# Patient Record
Sex: Female | Born: 1965 | Race: White | Hispanic: No | Marital: Married | Smoking: Never smoker
Health system: Southern US, Community
[De-identification: ages and names within clinical notes are randomized; demographics above are authoritative.]

## PROBLEM LIST (undated history)

## (undated) DIAGNOSIS — Z9221 Personal history of antineoplastic chemotherapy: Secondary | ICD-10-CM

## (undated) DIAGNOSIS — Z923 Personal history of irradiation: Secondary | ICD-10-CM

## (undated) DIAGNOSIS — R87619 Unspecified abnormal cytological findings in specimens from cervix uteri: Secondary | ICD-10-CM

## (undated) DIAGNOSIS — I10 Essential (primary) hypertension: Secondary | ICD-10-CM

## (undated) DIAGNOSIS — Z9889 Other specified postprocedural states: Secondary | ICD-10-CM

## (undated) DIAGNOSIS — R112 Nausea with vomiting, unspecified: Secondary | ICD-10-CM

## (undated) DIAGNOSIS — N6452 Nipple discharge: Secondary | ICD-10-CM

## (undated) HISTORY — DX: Unspecified abnormal cytological findings in specimens from cervix uteri: R87.619

## (undated) HISTORY — DX: Essential (primary) hypertension: I10

---
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
## 1999-03-02 ENCOUNTER — Inpatient Hospital Stay (HOSPITAL_COMMUNITY): Admission: AD | Admit: 1999-03-02 | Discharge: 1999-03-02 | Payer: Self-pay | Admitting: Obstetrics and Gynecology

## 1999-03-04 ENCOUNTER — Inpatient Hospital Stay (HOSPITAL_COMMUNITY): Admission: AD | Admit: 1999-03-04 | Discharge: 1999-03-04 | Payer: Self-pay | Admitting: *Deleted

## 1999-03-05 ENCOUNTER — Encounter (INDEPENDENT_AMBULATORY_CARE_PROVIDER_SITE_OTHER): Payer: Self-pay | Admitting: Specialist

## 1999-03-05 ENCOUNTER — Inpatient Hospital Stay (HOSPITAL_COMMUNITY): Admission: AD | Admit: 1999-03-05 | Discharge: 1999-03-08 | Payer: Self-pay | Admitting: Obstetrics and Gynecology

## 1999-04-19 ENCOUNTER — Other Ambulatory Visit: Admission: RE | Admit: 1999-04-19 | Discharge: 1999-04-19 | Payer: Self-pay | Admitting: Obstetrics and Gynecology

## 2000-06-12 ENCOUNTER — Other Ambulatory Visit: Admission: RE | Admit: 2000-06-12 | Discharge: 2000-06-12 | Payer: Self-pay | Admitting: Obstetrics and Gynecology

## 2000-09-11 ENCOUNTER — Ambulatory Visit (HOSPITAL_COMMUNITY): Admission: RE | Admit: 2000-09-11 | Discharge: 2000-09-11 | Payer: Self-pay | Admitting: Internal Medicine

## 2000-09-11 ENCOUNTER — Encounter: Payer: Self-pay | Admitting: Internal Medicine

## 2001-01-18 ENCOUNTER — Encounter: Admission: RE | Admit: 2001-01-18 | Discharge: 2001-01-18 | Payer: Self-pay | Admitting: Obstetrics and Gynecology

## 2001-01-18 ENCOUNTER — Encounter: Payer: Self-pay | Admitting: Obstetrics and Gynecology

## 2001-07-10 ENCOUNTER — Other Ambulatory Visit: Admission: RE | Admit: 2001-07-10 | Discharge: 2001-07-10 | Payer: Self-pay | Admitting: Obstetrics and Gynecology

## 2002-08-06 ENCOUNTER — Other Ambulatory Visit: Admission: RE | Admit: 2002-08-06 | Discharge: 2002-08-06 | Payer: Self-pay | Admitting: *Deleted

## 2003-09-08 ENCOUNTER — Other Ambulatory Visit: Admission: RE | Admit: 2003-09-08 | Discharge: 2003-09-08 | Payer: Self-pay | Admitting: Obstetrics and Gynecology

## 2004-09-14 ENCOUNTER — Other Ambulatory Visit: Admission: RE | Admit: 2004-09-14 | Discharge: 2004-09-14 | Payer: Self-pay | Admitting: Obstetrics and Gynecology

## 2005-10-19 ENCOUNTER — Other Ambulatory Visit: Admission: RE | Admit: 2005-10-19 | Discharge: 2005-10-19 | Payer: Self-pay | Admitting: Obstetrics and Gynecology

## 2008-05-22 ENCOUNTER — Other Ambulatory Visit: Admission: RE | Admit: 2008-05-22 | Discharge: 2008-05-22 | Payer: Self-pay | Admitting: Obstetrics & Gynecology

## 2008-12-21 ENCOUNTER — Ambulatory Visit (HOSPITAL_COMMUNITY): Admission: RE | Admit: 2008-12-21 | Discharge: 2008-12-22 | Payer: Self-pay | Admitting: Obstetrics & Gynecology

## 2008-12-21 ENCOUNTER — Encounter: Payer: Self-pay | Admitting: Obstetrics & Gynecology

## 2008-12-21 HISTORY — PX: TOTAL VAGINAL HYSTERECTOMY: SHX2548

## 2010-02-21 ENCOUNTER — Encounter: Admission: RE | Admit: 2010-02-21 | Discharge: 2010-02-21 | Payer: Self-pay | Admitting: Obstetrics & Gynecology

## 2010-03-01 ENCOUNTER — Encounter: Admission: RE | Admit: 2010-03-01 | Discharge: 2010-03-01 | Payer: Self-pay | Admitting: Obstetrics & Gynecology

## 2010-08-20 LAB — URINALYSIS, ROUTINE W REFLEX MICROSCOPIC
Bilirubin Urine: NEGATIVE
Glucose, UA: NEGATIVE mg/dL
Nitrite: NEGATIVE
Specific Gravity, Urine: <1.005 — ABNORMAL LOW (ref 1.005–1.030)
pH: 6 (ref 5.0–8.0)

## 2010-08-20 LAB — BASIC METABOLIC PANEL
BUN: 4 mg/dL — ABNORMAL LOW (ref 6–23)
BUN: 9 mg/dL (ref 6–23)
CO2: 25 mEq/L (ref 19–32)
Calcium: 9.4 mg/dL (ref 8.4–10.5)
Chloride: 106 mEq/L (ref 96–112)
GFR calc non Af Amer: >60 mL/min (ref >60)
Glucose, Bld: 109 mg/dL — ABNORMAL HIGH (ref 70–99)
Glucose, Bld: 92 mg/dL (ref 70–99)
Potassium: 3.8 mEq/L (ref 3.5–5.1)
Sodium: 139 mEq/L (ref 135–145)

## 2010-08-20 LAB — CBC
HCT: 42.8 % (ref 36.0–46.0)
MCHC: 34.6 g/dL (ref 30.0–36.0)
MCV: 94.8 fL (ref 78.0–100.0)
Platelets: 214 10*3/uL (ref 150–400)
Platelets: 228 10*3/uL (ref 150–400)
RDW: 12.4 % (ref 11.5–15.5)
WBC: 7 10*3/uL (ref 4.0–10.5)

## 2010-08-20 LAB — TYPE AND SCREEN: Antibody Screen: NEGATIVE

## 2010-08-20 LAB — APTT: aPTT: 28 seconds (ref 24–37)

## 2010-09-27 NOTE — Op Note (Signed)
April Henson                  ACCOUNT NO.:  [id number]   MEDICAL RECORD NO.:  [id number]          PATIENT TYPE:  OIB   LOCATION:  9303                          FACILITY:  [facility]   PHYSICIAN:  M. Leda Quail, MD  DATE OF BIRTH:  02-12-1966   DATE OF PROCEDURE:  12/21/2008  DATE OF DISCHARGE:                               OPERATIVE REPORT   PREOPERATIVE DIAGNOSES:  36. A 45 year old, gravida 1, para 1, married white female with      menorrhagia.  2. Dysfunctional uterine bleeding.  3. Fibroids.  4. Hypertension.  5. Failed Micronor therapy.   POSTOPERATIVE DIAGNOSES:  47. A 45 year old, gravida 1, para 1, married white female with      menorrhagia.  2. Dysfunctional uterine bleeding.  3. Fibroids.  4. Hypertension.  5. Failed Micronor therapy.   PROCEDURE:  Total vaginal hysterectomy.   SURGEON:  M. Leda Quail, MD   ASSISTANT:  April Felty. Romine, MD   ANESTHESIA:  General endotracheal, Dr. Arby Barrette oversaw the case.   FINDINGS:  Fibroid posteriorly.  Otherwise, normal-appearing ovaries and  tubes.   SPECIMENS:  Cervix and uterus sent to Pathology.   ESTIMATED BLOOD LOSS:  Less than 100.   URINE OUTPUT:  350 mL.   FLUIDS:  1600 mL of LR total.   COMPLICATIONS:  None.   INDICATIONS:  April Henson is a very nice 45 year old, G1, P75, married  white female with history of menorrhagia, dysfunctional uterine  bleeding, fibroids, and hypertension.  She has been experienced very  heavy menstrual cycles.  Due to her hypertension, she did not take  estrogen-containing pills and she has been on Micronor without much  success.  The regular bleeding has worsened due to Micronor.  She has  been counseled by conservative management versus continuing Micronor  versus Mirena IUD and endometrial ablation, and then definitive  management with surgery.  She has gone overall risks and benefits of  each procedure and has opted for hysterectomy.  All this was documented  in our  office chart and she presents for surgery today.   PROCEDURE:  The patient was taken to the operating room.  She was placed  in supine position.  Legs positioned in the April Henson stirrups in the low-  lithotomy position.  Anesthesia was administered by the anesthesia staff  without difficulty.  Right arm was tugged.  Left arm was extended with  running IV in place.  Good visualization of the hand.  Abdomen,  perineum, inner thighs, and vagina were prepped with Betadine prep and  in normal sterile fashion.  The patient is draped in this point.  Exam  under anesthesia is performed before I was gown and gloves and before  the prep was performed.  Once the prep was placed, then the patient's  legs were lifted to the high- lithotomy position.  A Foley catheter was  inserted under sterile conditions.  This was draped over the legs to  catheter straight drain.  Then, a Jacobson tenaculum was used to grasp  the cervix.  The cervix pulled down almost to the  vaginal opening.  Incision was made to proceed vaginally.  At this point, a weighted  speculum was placed in the posterior aspect of the vagina.  The cervix  was anesthetized with 1% Xylocaine mixed 1:1 with epinephrine(1:100,000  units.)  Posterior cul-de-sac was entered sharply.  A figure-of-eight  suture was used to attach posterior peritoneum and posterior vaginal  mucosa.  The uterosacral ligaments were serially clamped, cut, and  sutured with Heaney sutures.  Then, the anterior aspect of the cervix  was visualized.  Using a knife, the vaginal mucosa was incised from 3  o'clock to 9 o'clock position of cervix.  Then, using curved Mayo  scissors, pubovesical cervical fascia was dissected down to the level of  the cervix.  Then, using an open Ray-Tec plane between the bladder and  cervix was easily visualized.  The anterior flexion of peritoneum was  visualized.  It was a little to have get into the anterior peritoneum at  this point and  attention was turned back to the uterosacral ligament.  Cardinal ligaments were serially clamped, cut, and suture-ligated with  #0 Vicryl.  The uterine arteries were then clamped, incised, and suture-  ligated with #0 Vicryl.  Two sutures were placed over the uterine artery  pedicles bilaterally.  At this point, the anterior peritoneum could be  more easily visualized and was incised, and a Kirby retractor was placed  through this incision.  The fundus of the uterus was visualized.  The  broad ligament was serially clamped, incised, and suture-ligated with #0  Vicryl.  Once I was near the fundus of the uterus, the uterus could be  flipped and a curved Heaney clamps placed across the cornu of the uterus  bilaterally.  An open lap tape has been placed to push the bowel up  anteriorly.  These pedicles were incised and suture-ligated with free-  tie of 0 Vicryl, then a stick tie of #0 Vicryl.  Ovaries and tubes were  visualized.  No abnormalities were noted.  Pedicles were hemostatic at  this point.  The cuff was then run with a long 0 switched on stitch.  The running interlocking fashion started at 2 o'clock position ending at  10 o'clock position.  Normal saline was used to irrigate the pedicles  and the pelvis at this point.  No bleeding was noted.  An enterocele  stich was placed by passing a #2.0 vicryl through the posterior vaginal  mucosa and posterior peritoneum.  The medial third of the left  uterosacral ligament was encorporated.  The posterior peritoneum was  reefed across and the medial third of the right uterosacral ligament was  encorporated.  Finally the stich was brought out through the posterior  peritoneum.  The stich was tied tightly bringing the uterosacral  ligaments together at the vaginal cuff.  Three figure-of-eight sutures  of #0 Vicryl was then used to close the cuff.  At this point, all  instruments removed from the vagina.  The Foley catheter was removed.  The  patient's legs were positioned in the supine position.  She was  awakened from anesthesia and extubated without difficulty.  Sponge, lap,  needle, and instruments counts were correct x2.  A 30 mg IV Toradol was  given at this point.  She was taken to recovery room in stable  condition.      Lum Keas, MD  Electronically Signed     MSM/MEDQ  D:  12/21/2008  T:  12/22/2008  Job:  [id number] 

## 2010-09-27 NOTE — Discharge Summary (Signed)
NAMEJAILAH, April Henson                  ACCOUNT NO.:  [id number]   MEDICAL RECORD NO.:  [id number]          PATIENT TYPE:  OIB   LOCATION:  9303                          FACILITY:  [facility]   PHYSICIAN:  M. Leda Quail, MD  DATE OF BIRTH:  18-May-1965   DATE OF ADMISSION:  12/21/2008  DATE OF DISCHARGE:  12/22/2008                               DISCHARGE SUMMARY   PREOPERATIVE DIAGNOSES:  1. This is a 45 year old, G1, P60, married white female with      menorrhagia.  2. Dysfunctional uterine bleeding, on Micronor.  3. Fibroid uterus.  4. Hypertension.   DISCHARGE DIAGNOSES:  1. This is a 45 year old, G1, P92, married white female with      menorrhagia.  2. Dysfunctional uterine bleeding, on Micronor.  3. Fibroid uterus.  4. Hypertension.   PROCEDURE:  Transvaginal hysterectomy.   HISTORY OF PRESENT ILLNESS:  Written H and P is in the chart, but in  brief April Henson is a 45 year old married white female, who has a  history of menorrhagia.  She has been on Micronor in an attempt to  control, which has just made her bleeding more irregular.  She has  undergone an ultrasound and an endometrial biopsy which showed benign  tissue, but she did have fibroids on exam.  We talked about options for  her including endometrial ablation, Mirena IUD use, and definitive  management.  She ultimately decided to proceed with hysterectomy.  She  has done with childbearing.  Risks and benefits have been discussed with  the patient, and she presented on December 21, 2008, to proceed with  surgery.   HOSPITAL COURSE:  The patient was admitted to same-day surgery.  She was  taken to the operating room, transvaginal hysterectomy was performed.  She had a less than 100 mL of blood loss.  Surgery went very well and  lasted just less than an hour.  From the operating room, she was taken  to recovery room and her Foley catheter was removed before we left the  operating room.  She was given a Dilaudid low-dose  PCA for pain  management as well as schedule Toradol.  The patient did well in the  recovery room and from there she was transferred to the third floor.  She was able to void very easily and without difficulty.  Vital signs  were stable.  She was afebrile.  She was able to tolerate some liquids,  but she did have persistent nausea which Compazine nor Zofran helped.  Late in the evening of postoperative day #0, she was given some  Phenergan and this alleviated all of her issues.  She was able to sleep  and woke up without any nausea.  Since that time, she has done very  well, has been ambulating back and forth to the bathroom without any  issues with standing and also has passed flatus.  On the morning of  postoperative day #0, she reports excellent pain control and feeling  much much better.  T-max was 98.1, pulse was 50s-70s, blood pressure 112-  122/70-75, pulse  ox 96-97% on room air.  She voided 30-100 mL of clear  urine output.  Postop labs show white count 11.6, hemoglobin 13.2, and  platelets 214.  Postop labs also showed sodium 139, potassium 4.2, BUN  and creatinine 4 and 0.6 respectively.  Her exam was completely benign.  She had excellent bowel sounds.  Her abdomen was soft, nontender, and  nondistended.  Perineum was dry except for small amount of spotting on  her pad.  Extremities, no clubbing, cyanosis, or edema.  SCDs were  present bilaterally.  Her IV at this point, however, had already been  discontinued because it was causing her some tenderness at the site.  She was drinking liquids and had eaten crackers.  She had voided.  She  received regular breakfast and be transitioned to oral pain medicines.  If she does fine with all this, she will be discharged home in stable  condition with her husband.  Discharge instructions have been provided  in verbal form and will be provided in the written form.  She has  prescriptions for Motrin 800 mg 1 p.o. q.8 h. p.r.n. pain and  Percocet  5/325 one to two tablets p.o. q.4-6 hours p.r.n. pain.  The patient will  see me in approximately 2 weeks and she already has the prescription as  well.  The patient has my contact number if she has any issues or  concerns when she gets home.      Lum Keas, MD  Electronically Signed     MSM/MEDQ  D:  12/22/2008  T:  12/22/2008  Job:  [id number]

## 2011-06-27 ENCOUNTER — Other Ambulatory Visit: Payer: Self-pay | Admitting: Obstetrics & Gynecology

## 2011-06-27 DIAGNOSIS — Z1231 Encounter for screening mammogram for malignant neoplasm of breast: Secondary | ICD-10-CM

## 2011-07-06 ENCOUNTER — Ambulatory Visit
Admission: RE | Admit: 2011-07-06 | Discharge: 2011-07-06 | Disposition: A | Discharge status: Home / Self Care | Payer: 59 | Source: Ambulatory Visit | Attending: Obstetrics & Gynecology | Admitting: Obstetrics & Gynecology

## 2011-07-06 DIAGNOSIS — Z1231 Encounter for screening mammogram for malignant neoplasm of breast: Secondary | ICD-10-CM

## 2011-11-22 ENCOUNTER — Other Ambulatory Visit: Payer: Self-pay | Admitting: Obstetrics & Gynecology

## 2011-11-22 DIAGNOSIS — N6452 Nipple discharge: Secondary | ICD-10-CM

## 2011-12-05 ENCOUNTER — Ambulatory Visit
Admission: RE | Admit: 2011-12-05 | Discharge: 2011-12-05 | Disposition: A | Discharge status: Home / Self Care | Payer: 59 | Source: Ambulatory Visit | Attending: Obstetrics & Gynecology | Admitting: Obstetrics & Gynecology

## 2011-12-05 ENCOUNTER — Other Ambulatory Visit: Payer: Self-pay | Admitting: Obstetrics & Gynecology

## 2011-12-05 DIAGNOSIS — N6452 Nipple discharge: Secondary | ICD-10-CM

## 2011-12-21 ENCOUNTER — Ambulatory Visit (INDEPENDENT_AMBULATORY_CARE_PROVIDER_SITE_OTHER): Payer: Self-pay | Admitting: General Surgery

## 2012-01-08 ENCOUNTER — Ambulatory Visit (INDEPENDENT_AMBULATORY_CARE_PROVIDER_SITE_OTHER): Payer: 59 | Admitting: General Surgery

## 2012-01-08 ENCOUNTER — Encounter (INDEPENDENT_AMBULATORY_CARE_PROVIDER_SITE_OTHER): Payer: Self-pay | Admitting: General Surgery

## 2012-01-08 VITALS — BP 110/64 | HR 80 | Temp 97.8°F | Resp 20 | Ht 62.0 in | Wt 146.4 lb

## 2012-01-08 DIAGNOSIS — N6459 Other signs and symptoms in breast: Secondary | ICD-10-CM

## 2012-01-08 DIAGNOSIS — N6452 Nipple discharge: Secondary | ICD-10-CM | POA: Insufficient documentation

## 2012-01-08 NOTE — Patient Instructions (Signed)
IF YOU ARE TAKING ASPIRIN, COUMADIN/WARFARIN, PLAVIX, OR OTHER BLOOD THINNER, PLEASE LET us KNOW IMMEDIATELY.  WE WILL NEED TO DISCUSS WITH THE PRESCRIBING PROVIDER IF THESE ARE SAFE TO STOP. IF THESE ARE NOT STOPPED AT THE APPROPRIATE TIME, THIS WILL RESULT IN A DELAY FOR YOUR SURGERY.  DO NOT TAKE THESE MEDICATIONS OR IBUPROFEN/NAPROXEN WITHIN A WEEK BEFORE SURGERY.   The main risks of surgery are bleeding, infection, damage to other structures, and seroma (accumulation of fluid) under the incision site(s).    These complications may lead to additional procedures such as drainage of seroma/infection.  If cancer is found (unlikely), you may need other surgeries to obtain negative margins or to take lymph nodes.   Most women do accumulate fluid in the breast cavity where the specimen was removed. We do not always have to drain this fluid.  If your breast is very tense, painful, or red, then we may need to numb the skin and use a needle to aspirate the fluid.  We do provide patients with a Breast Binder.  The purpose of this is to avoid the use of tape on the sensitive tissue of the breast and to provide some compression to minimize the risk of seroma.  If the binder is uncomfortable, you may find that a tank top with a built-in shelf bra or a loose sports bra works better for you.  I recommend wearing this around the clock for the first 1-2 weeks except in the shower.    You may remove your dressings and may shower 48 hours after surgery.   Many patients have some constipation in the week after surgery due to the narcotics and anesthesia.  You may need over the counter stool softeners or laxatives if you experience difficulty having bowel movements.    If the following occur, call our office at [phone]: If you have a fever over 101 or pain that is severe despite narcotics. If you have redness or drainage at the wound. If you develop persistent nausea or vomiting.  I will follow you back up  in 1-4 weeks.    Please submit any paperwork about time off work/insurance forms to the front desk.

## 2012-01-08 NOTE — Assessment & Plan Note (Signed)
Plan major duct excision. Nipple discharge is pale and yellow, so cancer is unlikely.  The surgical procedure was described to the patient.  I discussed the incision type and location and whether we would need radiology involved on the day of surgery with a wire marker and/or sentinel node.      The risks and benefits of the procedure were described to the patient and he/she wishes to proceed.    We discussed the risks bleeding, infection, damage to other structures, need for further procedures/surgeries.  We discussed the risk of seroma.  The patient was advised if the area in the breast in cancer, we may need to go back to surgery for additional tissue to obtain negative margins or for a lymph node biopsy. The patient was advised that these are the most common complications, but that others can occur as well.  They were advised against taking aspirin or other anti-inflammatory agents/blood thinners the week before surgery.

## 2012-01-08 NOTE — Progress Notes (Signed)
Chief Complaint  Patient presents with  . Pre-op Exam    eval lt nipple discharge    HISTORY: Patient is a 46 year old female who has had approximately 6 months of left nipple discharge. This is intermittent.  She states it is worse after a hot shower. It is generally only when she presses on her nipple. Her breast is sore like the soreness that she used to get when she had her periods. She denies bloody nipple discharge. She denies feeling a mass. She has had one abnormal mammogram in the past that she got called back for, but has not had any biopsies. She has no family history of breast cancer, but her mother had uterine cancer.  Past Medical History  Diagnosis Date  . Hypertension     Past Surgical History  Procedure Date  . Abdominal hysterectomy     partial  . Wisdom tooth extraction 1986  . Broken nose     Current Outpatient Prescriptions  Medication Sig Dispense Refill  . lisinopril (PRINIVIL,ZESTRIL) 10 MG tablet       . NIFEDICAL XL 30 MG 24 hr tablet          Allergies  Allergen Reactions  . Codeine Hives and Itching     Family History  Problem Relation Age of Onset  . Cancer Mother     uterine  . Hypertension Mother   . Hyperlipidemia Mother   . Stroke Mother   . Hypertension Father      History   Social History  . Marital Status: Married    Spouse Name: N/A    Number of Children: N/A  . Years of Education: N/A   Social History Main Topics  . Smoking status: Never Smoker   . Smokeless tobacco: Never Used  . Alcohol Use: No  . Drug Use: No  . Sexually Active: None    REVIEW OF SYSTEMS - PERTINENT POSITIVES ONLY: 12 point review of systems negative other than HPI and PMH  EXAM: Filed Vitals:   01/08/12 1558  BP: 110/64  Pulse: 80  Temp: 97.8 F (36.6 C)  Resp: 20    Gen:  No acute distress.  Well nourished and well groomed.   Neurological: Alert and oriented to person, place, and time. Coordination normal.  Head: Normocephalic and  atraumatic.  Eyes: Conjunctivae are normal. Pupils are equal, round, and reactive to light. No scleral icterus.  Neck: Normal range of motion. Neck supple. No tracheal deviation or thyromegaly present.  Cardiovascular: Normal rate, regular rhythm, normal heart sounds and intact distal pulses.  Exam reveals no gallop and no friction rub.  No murmur heard. Respiratory: Effort normal.  No respiratory distress. No chest wall tenderness. Breath sounds normal.  No wheezes, rales or rhonchi.  Breast:  Breasts are symmetric bilaterally.  No masses either breast, but they are dense bilaterally.  Yellow nipple discharge can be expressed from left breast.  No axillary lymphadenopathy.   GI: Soft. Bowel sounds are normal. The abdomen is soft and nontender.  There is no rebound and no guarding.  Musculoskeletal: Normal range of motion. Extremities are nontender.  Lymphadenopathy: No cervical, preauricular, postauricular or axillary adenopathy is present Skin: Skin is warm and dry. No rash noted. No diaphoresis. No erythema. No pallor. No clubbing, cyanosis, or edema.   Psychiatric: Normal mood and affect. Behavior is normal. Judgment and thought content normal.    LABORATORY RESULTS: Available labs are reviewed    RADIOLOGY RESULTS: See E-Chart or I-Site for  most recent results.  Images and reports are reviewed.  Ductogram: IMPRESSION:  Intraductal filling defect, left breast for which surgical  consultation is recommended. An appointment is made with Dr.  Donell Beers on August 8 at 1:00 p.m.  RECOMMENDATION:  Recommend surgical excision, left breast mass.  BI-RADS CATEGORY 4: Suspicious abnormality - biopsy should be  considered.   Mammogram Findings: There is a fibroglandular pattern. No mass or  suspicious calcifications are seen in the left breast. Compared to  priors.  Mammographic images were processed with CAD    ASSESSMENT AND PLAN: No problem-specific assessment & plan notes found for  this encounter.     Maudry Diego MD Surgical Oncology, General and Endocrine Surgery [facility]      Visit Diagnoses: 1. Nipple discharge     Primary Care Physician: No primary provider on file.

## 2012-01-14 DIAGNOSIS — N6452 Nipple discharge: Secondary | ICD-10-CM

## 2012-01-14 HISTORY — DX: Nipple discharge: N64.52

## 2012-01-22 ENCOUNTER — Encounter (HOSPITAL_BASED_OUTPATIENT_CLINIC_OR_DEPARTMENT_OTHER): Payer: Self-pay | Admitting: *Deleted

## 2012-01-22 NOTE — Pre-Procedure Instructions (Signed)
To come for BMET; EKG req. from [facility]. 

## 2012-01-22 NOTE — Pre-Procedure Instructions (Signed)
No EKG at [facility] - will do EKG when pt. comes for lab

## 2012-01-23 ENCOUNTER — Encounter (HOSPITAL_BASED_OUTPATIENT_CLINIC_OR_DEPARTMENT_OTHER)
Admission: RE | Admit: 2012-01-23 | Discharge: 2012-01-23 | Disposition: A | Discharge status: Home / Self Care | Payer: 59 | Source: Ambulatory Visit | Attending: General Surgery | Admitting: General Surgery

## 2012-01-23 LAB — BASIC METABOLIC PANEL
CO2: 25 mEq/L (ref 19–32)
Chloride: 102 mEq/L (ref 96–112)
GFR calc Af Amer: >90 mL/min (ref >90)
Sodium: 136 mEq/L (ref 135–145)

## 2012-01-25 ENCOUNTER — Telehealth (INDEPENDENT_AMBULATORY_CARE_PROVIDER_SITE_OTHER): Payer: Self-pay

## 2012-01-25 NOTE — Telephone Encounter (Signed)
Pt calling b/c her post op appt got r/s from 9/30 to 10/21 and she does not want to wait that long to be checked by Dr Donell Beers after her sx date on 9/13. The pt wants to be seen sooner than later by Dr Donell Beers. I advised pt that Dr Donell Beers has had to cancel 2 all day offices due to surgery so that is the reason why she got r/s but the pt wants to be seen sooner.

## 2012-01-26 ENCOUNTER — Encounter (HOSPITAL_BASED_OUTPATIENT_CLINIC_OR_DEPARTMENT_OTHER): Payer: Self-pay | Admitting: General Surgery

## 2012-01-26 ENCOUNTER — Encounter (HOSPITAL_BASED_OUTPATIENT_CLINIC_OR_DEPARTMENT_OTHER): Payer: Self-pay | Admitting: Anesthesiology

## 2012-01-26 ENCOUNTER — Encounter (HOSPITAL_BASED_OUTPATIENT_CLINIC_OR_DEPARTMENT_OTHER)
Admission: RE | Disposition: A | Discharge status: Home / Self Care | Payer: Self-pay | Source: Ambulatory Visit | Attending: General Surgery

## 2012-01-26 ENCOUNTER — Ambulatory Visit (HOSPITAL_BASED_OUTPATIENT_CLINIC_OR_DEPARTMENT_OTHER): Payer: 59 | Admitting: Anesthesiology

## 2012-01-26 ENCOUNTER — Ambulatory Visit (HOSPITAL_BASED_OUTPATIENT_CLINIC_OR_DEPARTMENT_OTHER)
Admission: RE | Admit: 2012-01-26 | Discharge: 2012-01-26 | Disposition: A | Discharge status: Home / Self Care | Payer: 59 | Source: Ambulatory Visit | Attending: General Surgery | Admitting: General Surgery

## 2012-01-26 DIAGNOSIS — I10 Essential (primary) hypertension: Secondary | ICD-10-CM | POA: Insufficient documentation

## 2012-01-26 DIAGNOSIS — D249 Benign neoplasm of unspecified breast: Secondary | ICD-10-CM

## 2012-01-26 HISTORY — DX: Nipple discharge: N64.52

## 2012-01-26 HISTORY — DX: Other specified postprocedural states: Z98.890

## 2012-01-26 HISTORY — PX: BREAST DUCTAL SYSTEM EXCISION: SHX5242

## 2012-01-26 HISTORY — DX: Nausea with vomiting, unspecified: R11.2

## 2012-01-26 SURGERY — EXCISION DUCTAL SYSTEM BREAST
Anesthesia: General | Site: Breast | Laterality: Left | Wound class: Clean

## 2012-01-26 MED ORDER — ONDANSETRON HCL 4 MG/2ML IJ SOLN
INTRAMUSCULAR | Status: DC | PRN
  Administered 2012-01-26: 4 mg via INTRAVENOUS

## 2012-01-26 MED ORDER — LIDOCAINE HCL (CARDIAC) 20 MG/ML IV SOLN
INTRAVENOUS | Status: DC | PRN
  Administered 2012-01-26: 50 mg via INTRAVENOUS

## 2012-01-26 MED ORDER — CEFAZOLIN SODIUM-DEXTROSE 2-3 GM-% IV SOLR
2.0000 g | INTRAVENOUS | Status: AC
  Administered 2012-01-26: 2 g via INTRAVENOUS

## 2012-01-26 MED ORDER — HYDROCODONE-ACETAMINOPHEN 5-325 MG PO TABS: 1.0000 | ORAL_TABLET | ORAL | Status: AC | PRN

## 2012-01-26 MED ORDER — DEXAMETHASONE SODIUM PHOSPHATE 4 MG/ML IJ SOLN
INTRAMUSCULAR | Status: DC | PRN
  Administered 2012-01-26: 10 mg via INTRAVENOUS

## 2012-01-26 MED ORDER — IBUPROFEN 200 MG PO TABS: 600.0000 mg | ORAL_TABLET | Freq: Four times a day (QID) | ORAL | Status: AC | PRN

## 2012-01-26 MED ORDER — ACETAMINOPHEN 10 MG/ML IV SOLN
1000.0000 mg | Freq: Once | INTRAVENOUS | Status: AC
  Administered 2012-01-26: 1000 mg via INTRAVENOUS

## 2012-01-26 MED ORDER — PROPOFOL 10 MG/ML IV BOLUS
INTRAVENOUS | Status: DC | PRN
  Administered 2012-01-26: 200 mg via INTRAVENOUS

## 2012-01-26 MED ORDER — MIDAZOLAM HCL 5 MG/5ML IJ SOLN
INTRAMUSCULAR | Status: DC | PRN
  Administered 2012-01-26: 2 mg via INTRAVENOUS

## 2012-01-26 MED ORDER — FENTANYL CITRATE 0.05 MG/ML IJ SOLN
INTRAMUSCULAR | Status: DC | PRN
  Administered 2012-01-26: 25 ug via INTRAVENOUS
  Administered 2012-01-26: 50 ug via INTRAVENOUS
  Administered 2012-01-26: 25 ug via INTRAVENOUS
  Administered 2012-01-26: 50 ug via INTRAVENOUS

## 2012-01-26 MED ORDER — PHENYLEPHRINE HCL 10 MG/ML IJ SOLN
INTRAMUSCULAR | Status: DC | PRN
  Administered 2012-01-26: 80 ug via INTRAVENOUS

## 2012-01-26 MED ORDER — LACTATED RINGERS IV SOLN
INTRAVENOUS | Status: DC
  Administered 2012-01-26: 10 mL/h via INTRAVENOUS
  Administered 2012-01-26: 07:00:00 via INTRAVENOUS

## 2012-01-26 MED ORDER — OXYCODONE HCL 5 MG/5ML PO SOLN: 5.0000 mg | Freq: Once | ORAL | Status: DC | PRN

## 2012-01-26 MED ORDER — LIDOCAINE-EPINEPHRINE (PF) 1 %-1:200000 IJ SOLN
INTRAMUSCULAR | Status: DC | PRN
  Administered 2012-01-26: 08:00:00 via INTRAMUSCULAR

## 2012-01-26 MED ORDER — HYDROMORPHONE HCL PF 1 MG/ML IJ SOLN
0.2500 mg | INTRAMUSCULAR | Status: DC | PRN
  Administered 2012-01-26: 0.25 mg via INTRAVENOUS

## 2012-01-26 MED ORDER — PROMETHAZINE HCL 25 MG/ML IJ SOLN
6.2500 mg | Freq: Once | INTRAMUSCULAR | Status: AC
  Administered 2012-01-26: 6.25 mg via INTRAVENOUS

## 2012-01-26 MED ORDER — OXYCODONE HCL 5 MG PO TABS: 5.0000 mg | ORAL_TABLET | Freq: Once | ORAL | Status: DC | PRN

## 2012-01-26 MED ORDER — PROMETHAZINE HCL 25 MG/ML IJ SOLN: 6.2500 mg | INTRAMUSCULAR | Status: DC | PRN

## 2012-01-26 MED ORDER — SCOPOLAMINE 1 MG/3DAYS TD PT72
1.0000 | MEDICATED_PATCH | TRANSDERMAL | Status: DC
  Administered 2012-01-26: 1.5 mg via TRANSDERMAL

## 2012-01-26 SURGICAL SUPPLY — 43 items
BINDER BREAST LRG (GAUZE/BANDAGES/DRESSINGS) ×2 IMPLANT
BLADE HEX COATED 2.75 (ELECTRODE) ×2 IMPLANT
BLADE SURG 15 STRL LF DISP TIS (BLADE) ×1 IMPLANT
BLADE SURG 15 STRL SS (BLADE) ×1
CANISTER SUCTION 1200CC (MISCELLANEOUS) ×2 IMPLANT
CHLORAPREP W/TINT 26ML (MISCELLANEOUS) ×2 IMPLANT
CLIP TI WIDE RED SMALL 6 (CLIP) IMPLANT
CLOTH BEACON ORANGE TIMEOUT ST (SAFETY) ×2 IMPLANT
COVER MAYO STAND STRL (DRAPES) ×2 IMPLANT
COVER TABLE BACK 60X90 (DRAPES) ×2 IMPLANT
DECANTER SPIKE VIAL GLASS SM (MISCELLANEOUS) IMPLANT
DEVICE DUBIN W/COMP PLATE 8390 (MISCELLANEOUS) IMPLANT
DRAPE PED LAPAROTOMY (DRAPES) ×2 IMPLANT
DRAPE UTILITY XL STRL (DRAPES) ×2 IMPLANT
DRSG PAD ABDOMINAL 8X10 ST (GAUZE/BANDAGES/DRESSINGS) ×2 IMPLANT
ELECT REM PT RETURN 9FT ADLT (ELECTROSURGICAL) ×2
ELECTRODE REM PT RTRN 9FT ADLT (ELECTROSURGICAL) ×1 IMPLANT
GAUZE SPONGE 4X4 12PLY STRL LF (GAUZE/BANDAGES/DRESSINGS) ×2 IMPLANT
GLOVE BIO SURGEON STRL SZ 6 (GLOVE) ×2 IMPLANT
GLOVE BIOGEL PI IND STRL 6.5 (GLOVE) ×1 IMPLANT
GLOVE BIOGEL PI INDICATOR 6.5 (GLOVE) ×1
GOWN PREVENTION PLUS XLARGE (GOWN DISPOSABLE) IMPLANT
GOWN PREVENTION PLUS XXLARGE (GOWN DISPOSABLE) ×4 IMPLANT
NEEDLE HYPO 25X1 1.5 SAFETY (NEEDLE) ×2 IMPLANT
NS IRRIG 1000ML POUR BTL (IV SOLUTION) ×2 IMPLANT
PACK BASIN DAY SURGERY FS (CUSTOM PROCEDURE TRAY) ×2 IMPLANT
PENCIL BUTTON HOLSTER BLD 10FT (ELECTRODE) ×2 IMPLANT
SLEEVE SCD COMPRESS KNEE MED (MISCELLANEOUS) ×2 IMPLANT
SPONGE LAP 18X18 X RAY DECT (DISPOSABLE) ×2 IMPLANT
STAPLER VISISTAT 35W (STAPLE) IMPLANT
STRIP CLOSURE SKIN 1/2X4 (GAUZE/BANDAGES/DRESSINGS) ×2 IMPLANT
SUT MON AB 4-0 PC3 18 (SUTURE) ×2 IMPLANT
SUT SILK 2 0 SH (SUTURE) ×2 IMPLANT
SUT VIC AB 3-0 54X BRD REEL (SUTURE) IMPLANT
SUT VIC AB 3-0 BRD 54 (SUTURE)
SUT VIC AB 3-0 SH 27 (SUTURE) ×1
SUT VIC AB 3-0 SH 27X BRD (SUTURE) ×1 IMPLANT
SYR CONTROL 10ML LL (SYRINGE) ×2 IMPLANT
TOWEL OR 17X24 6PK STRL BLUE (TOWEL DISPOSABLE) ×2 IMPLANT
TOWEL OR NON WOVEN STRL DISP B (DISPOSABLE) ×2 IMPLANT
TUBE CONNECTING 20X1/4 (TUBING) ×2 IMPLANT
WATER STERILE IRR 1000ML POUR (IV SOLUTION) IMPLANT
YANKAUER SUCT BULB TIP NO VENT (SUCTIONS) ×2 IMPLANT

## 2012-01-26 NOTE — Anesthesia Preprocedure Evaluation (Signed)
Anesthesia Evaluation  Patient identified by MRN, date of birth, ID band Patient awake    Reviewed: Allergy & Precautions, H&P , NPO status , Patient's Chart, lab work & pertinent test results  History of Anesthesia Complications (+) PONV  Airway Mallampati: II TM Distance: >3 FB Neck ROM: Full    Dental No notable dental hx. (+) Teeth Intact and Dental Advisory Given   Pulmonary neg pulmonary ROS,  breath sounds clear to auscultation  Pulmonary exam normal       Cardiovascular hypertension, On Medications Rhythm:Regular Rate:Normal     Neuro/Psych negative neurological ROS  negative psych ROS   GI/Hepatic negative GI ROS, Neg liver ROS,   Endo/Other  negative endocrine ROS  Renal/GU negative Renal ROS  negative genitourinary   Musculoskeletal   Abdominal   Peds  Hematology negative hematology ROS (+)   Anesthesia Other Findings   Reproductive/Obstetrics negative OB ROS                           Anesthesia Physical Anesthesia Plan  ASA: II  Anesthesia Plan: General   Post-op Pain Management:    Induction: Intravenous  Airway Management Planned: LMA  Additional Equipment:   Intra-op Plan:   Post-operative Plan: Extubation in OR  Informed Consent: I have reviewed the patients History and Physical, chart, labs and discussed the procedure including the risks, benefits and alternatives for the proposed anesthesia with the patient or authorized representative who has indicated his/her understanding and acceptance.   Dental advisory given  Plan Discussed with: CRNA  Anesthesia Plan Comments:         Anesthesia Quick Evaluation

## 2012-01-26 NOTE — H&P (View-Only) (Signed)
Chief Complaint  Patient presents with  . Pre-op Exam    eval lt nipple discharge    HISTORY: Patient is a 46-year-old female who has had approximately 6 months of left nipple discharge. This is intermittent.  She states it is worse after a hot shower. It is generally only when she presses on her nipple. Her breast is sore like the soreness that she used to get when she had her periods. She denies bloody nipple discharge. She denies feeling a mass. She has had one abnormal mammogram in the past that she got called back for, but has not had any biopsies. She has no family history of breast cancer, but her mother had uterine cancer.  Past Medical History  Diagnosis Date  . Hypertension     Past Surgical History  Procedure Date  . Abdominal hysterectomy     partial  . Wisdom tooth extraction 1986  . Broken nose     Current Outpatient Prescriptions  Medication Sig Dispense Refill  . lisinopril (PRINIVIL,ZESTRIL) 10 MG tablet       . NIFEDICAL XL 30 MG 24 hr tablet          Allergies  Allergen Reactions  . Codeine Hives and Itching     Family History  Problem Relation Age of Onset  . Cancer Mother     uterine  . Hypertension Mother   . Hyperlipidemia Mother   . Stroke Mother   . Hypertension Father      History   Social History  . Marital Status: Married    Spouse Name: N/A    Number of Children: N/A  . Years of Education: N/A   Social History Main Topics  . Smoking status: Never Smoker   . Smokeless tobacco: Never Used  . Alcohol Use: No  . Drug Use: No  . Sexually Active: None    REVIEW OF SYSTEMS - PERTINENT POSITIVES ONLY: 12 point review of systems negative other than HPI and PMH  EXAM: Filed Vitals:   01/08/12 1558  BP: 110/64  Pulse: 80  Temp: 97.8 F (36.6 C)  Resp: 20    Gen:  No acute distress.  Well nourished and well groomed.   Neurological: Alert and oriented to person, place, and time. Coordination normal.  Head: Normocephalic and  atraumatic.  Eyes: Conjunctivae are normal. Pupils are equal, round, and reactive to light. No scleral icterus.  Neck: Normal range of motion. Neck supple. No tracheal deviation or thyromegaly present.  Cardiovascular: Normal rate, regular rhythm, normal heart sounds and intact distal pulses.  Exam reveals no gallop and no friction rub.  No murmur heard. Respiratory: Effort normal.  No respiratory distress. No chest wall tenderness. Breath sounds normal.  No wheezes, rales or rhonchi.  Breast:  Breasts are symmetric bilaterally.  No masses either breast, but they are dense bilaterally.  Yellow nipple discharge can be expressed from left breast.  No axillary lymphadenopathy.   GI: Soft. Bowel sounds are normal. The abdomen is soft and nontender.  There is no rebound and no guarding.  Musculoskeletal: Normal range of motion. Extremities are nontender.  Lymphadenopathy: No cervical, preauricular, postauricular or axillary adenopathy is present Skin: Skin is warm and dry. No rash noted. No diaphoresis. No erythema. No pallor. No clubbing, cyanosis, or edema.   Psychiatric: Normal mood and affect. Behavior is normal. Judgment and thought content normal.    LABORATORY RESULTS: Available labs are reviewed    RADIOLOGY RESULTS: See E-Chart or I-Site for   most recent results.  Images and reports are reviewed.  Ductogram: IMPRESSION:  Intraductal filling defect, left breast for which surgical  consultation is recommended. An appointment is made with Dr.  Marylou Wages on August 8 at 1:00 p.m.  RECOMMENDATION:  Recommend surgical excision, left breast mass.  BI-RADS CATEGORY 4: Suspicious abnormality - biopsy should be  considered.   Mammogram Findings: There is a fibroglandular pattern. No mass or  suspicious calcifications are seen in the left breast. Compared to  priors.  Mammographic images were processed with CAD    ASSESSMENT AND PLAN: No problem-specific assessment & plan notes found for  this encounter.     Alic Hilburn L Hanh Kertesz MD Surgical Oncology, General and Endocrine Surgery [facility]      Visit Diagnoses: 1. Nipple discharge     Primary Care Physician: No primary provider on file.    

## 2012-01-26 NOTE — Interval H&P Note (Signed)
History and Physical Interval Note:  01/26/2012 7:35 AM  April Henson  has presented today for surgery, with the diagnosis of left nipple discharge  The various methods of treatment have been discussed with the patient and family. After consideration of risks, benefits and other options for treatment, the patient has consented to  Procedure(s) (LRB) with comments: EXCISION DUCTAL SYSTEM BREAST (Left) - left breast major duct excision as a surgical intervention .  The patient's history has been reviewed, patient examined, no change in status, stable for surgery.  I have reviewed the patient's chart and labs.  Questions were answered to the patient's satisfaction.     Ellanor Feuerstein

## 2012-01-26 NOTE — Op Note (Signed)
Excisional Breast Biopsy, Major Duct excision  Indications: This patient presents with history of left nipple discharge with suspicion for papilloma on ductogram  Pre-operative Diagnosis: left nipple discharge  Post-operative Diagnosis: left nipple discharge  Surgeon: WUJWJX,BJYNW   Anesthesia: General endotracheal anesthesia  ASA Class: 2  Procedure Details  The patient was seen in the Holding Room. The risks, benefits, complications, treatment options, and expected outcomes were discussed with the patient. The possibilities of reaction to medication, pulmonary aspiration, bleeding, infection, the need for additional procedures, failure to diagnose a condition, and creating a complication requiring transfusion or operation were discussed with the patient. The patient concurred with the proposed plan, giving informed consent.  The site of surgery properly noted/marked. The patient was taken to Operating Room # 8, identified, and the procedure verified as Breast Excisional Biopsy. A Time Out was held and the above information confirmed.  After induction of anesthesia, the left  breast and chest were prepped and draped in standard fashion.  The lacrimal duct probes were used to locate the duct with the discharge.   The lumpectomy was performed by creating an oblique incision over the lower outer quadrant of the breast in the circumareolar location.  Dissection was carried down around the lacrimal duct probe in an almost straight posterior direction with the curved Mayo scissors.  Orientation sutures were placed and the skin and pectoralis margins were inked.  Hemostasis was achieved with cautery.  The wound was irrigated and closed with a 3-0 Vicryl interrupted deep dermal stitch and a 4-0 Monocryl subcuticular closure in layers.    Sterile dressings were applied. At the end of the operation, all sponge, instrument, and needle counts were correct.  Findings: grossly clear surgical  margins  Estimated Blood Loss:  Minimal  Specimens: left breast major duct excision         Complications:  None; patient tolerated the procedure well.         Disposition: PACU - hemodynamically stable.         Condition: stable

## 2012-01-26 NOTE — Transfer of Care (Signed)
Immediate Anesthesia Transfer of Care Note  Patient: April Henson  Procedure(s) Performed: Procedure(s) (LRB) with comments: EXCISION DUCTAL SYSTEM BREAST (Left) - left breast major duct excision  Patient Location: PACU  Anesthesia Type: General  Level of Consciousness: awake, alert  and oriented  Airway & Oxygen Therapy: Patient Spontanous Breathing and Patient connected to face mask oxygen  Post-op Assessment: Report given to PACU RN and Post -op Vital signs reviewed and stable  Post vital signs: Reviewed and stable  Complications: No apparent anesthesia complications

## 2012-01-26 NOTE — Anesthesia Postprocedure Evaluation (Signed)
  Anesthesia Post-op Note  Patient: April Henson  Procedure(s) Performed: Procedure(s) (LRB) with comments: EXCISION DUCTAL SYSTEM BREAST (Left) - left breast major duct excision  Patient Location: PACU  Anesthesia Type: General  Level of Consciousness: awake and alert   Airway and Oxygen Therapy: Patient Spontanous Breathing  Post-op Pain: mild  Post-op Assessment: Post-op Vital signs reviewed, Patient's Cardiovascular Status Stable, Respiratory Function Stable, Patent Airway and No signs of Nausea or vomiting  Post-op Vital Signs: Reviewed and stable  Complications: No apparent anesthesia complications

## 2012-01-29 NOTE — Progress Notes (Signed)
Quick Note:  Please call pt and let know that pathology is Benign. ______

## 2012-01-30 ENCOUNTER — Telehealth (INDEPENDENT_AMBULATORY_CARE_PROVIDER_SITE_OTHER): Payer: Self-pay

## 2012-01-30 ENCOUNTER — Encounter (HOSPITAL_BASED_OUTPATIENT_CLINIC_OR_DEPARTMENT_OTHER): Payer: Self-pay | Admitting: General Surgery

## 2012-01-30 NOTE — Telephone Encounter (Signed)
I left message for patient to return our phone call. Her path came back benign. Please give patient results if she calls back.

## 2012-01-31 NOTE — Telephone Encounter (Signed)
Pt called this morning and was given pathology results.

## 2012-02-12 ENCOUNTER — Encounter (INDEPENDENT_AMBULATORY_CARE_PROVIDER_SITE_OTHER): Payer: 59 | Admitting: General Surgery

## 2012-02-15 ENCOUNTER — Ambulatory Visit (INDEPENDENT_AMBULATORY_CARE_PROVIDER_SITE_OTHER): Payer: 59 | Admitting: General Surgery

## 2012-02-15 ENCOUNTER — Encounter (INDEPENDENT_AMBULATORY_CARE_PROVIDER_SITE_OTHER): Payer: Self-pay | Admitting: General Surgery

## 2012-02-15 VITALS — BP 114/66 | HR 68 | Temp 99.2°F | Resp 16 | Ht 62.0 in | Wt 147.0 lb

## 2012-02-15 DIAGNOSIS — N6459 Other signs and symptoms in breast: Secondary | ICD-10-CM

## 2012-02-15 DIAGNOSIS — N6452 Nipple discharge: Secondary | ICD-10-CM

## 2012-02-15 NOTE — Assessment & Plan Note (Signed)
Doing well.   Follow up as needed.  Get back on mammogram schedule.

## 2012-02-15 NOTE — Patient Instructions (Signed)
Follow up as needed  Activity as tolerated

## 2012-02-15 NOTE — Progress Notes (Signed)
HISTORY: S/p major duct excision.  Only taking occasional tylenol for soreness.  Has some firm areas at surgical site.  No wound drainage.      EXAM: General:  Alert and oriented Incision:  No erythema.  Small hematoma.     PATHOLOGY: Diagnosis Breast, excision, Left - MULTIPLE INTRADUCTAL PAPILLOMAS. - DILATED DUCTS AND FIBROCYSTIC CHANGES. - NO ATYPIA, HYPERPLASIA OR MALIGNANCY IDENTIFIED.   ASSESSMENT AND PLAN:   Left Nipple discharge Doing well.   Follow up as needed.  Get back on mammogram schedule.      Maudry Diego, MD Surgical Oncology, General & Endocrine Surgery [facility]  [place], [place], [place] E.,*

## 2012-03-04 ENCOUNTER — Encounter (INDEPENDENT_AMBULATORY_CARE_PROVIDER_SITE_OTHER): Payer: 59 | Admitting: General Surgery

## 2015-05-16 HISTORY — PX: BREAST LUMPECTOMY: SHX2

## 2016-01-12 ENCOUNTER — Other Ambulatory Visit: Payer: Self-pay | Admitting: Obstetrics & Gynecology

## 2016-01-12 DIAGNOSIS — Z1231 Encounter for screening mammogram for malignant neoplasm of breast: Secondary | ICD-10-CM

## 2016-01-19 ENCOUNTER — Ambulatory Visit
Admission: RE | Admit: 2016-01-19 | Discharge: 2016-01-19 | Disposition: A | Discharge status: Home / Self Care | Payer: Commercial Managed Care - HMO | Source: Ambulatory Visit | Attending: Obstetrics & Gynecology | Admitting: Obstetrics & Gynecology

## 2016-01-19 DIAGNOSIS — Z1231 Encounter for screening mammogram for malignant neoplasm of breast: Secondary | ICD-10-CM

## 2016-01-21 ENCOUNTER — Other Ambulatory Visit: Payer: Self-pay | Admitting: Obstetrics & Gynecology

## 2016-01-21 DIAGNOSIS — R928 Other abnormal and inconclusive findings on diagnostic imaging of breast: Secondary | ICD-10-CM

## 2016-01-26 ENCOUNTER — Ambulatory Visit
Admission: RE | Admit: 2016-01-26 | Discharge: 2016-01-26 | Disposition: A | Discharge status: Home / Self Care | Payer: Commercial Managed Care - HMO | Source: Ambulatory Visit | Attending: Obstetrics & Gynecology | Admitting: Obstetrics & Gynecology

## 2016-01-26 DIAGNOSIS — R928 Other abnormal and inconclusive findings on diagnostic imaging of breast: Secondary | ICD-10-CM

## 2016-01-28 ENCOUNTER — Encounter: Payer: Self-pay | Admitting: Nurse Practitioner

## 2016-01-28 ENCOUNTER — Ambulatory Visit (INDEPENDENT_AMBULATORY_CARE_PROVIDER_SITE_OTHER): Payer: Commercial Managed Care - HMO | Admitting: Nurse Practitioner

## 2016-01-28 ENCOUNTER — Other Ambulatory Visit: Payer: Commercial Managed Care - HMO

## 2016-01-28 VITALS — BP 122/80 | HR 68 | Ht 61.5 in | Wt 147.0 lb

## 2016-01-28 DIAGNOSIS — Z01419 Encounter for gynecological examination (general) (routine) without abnormal findings: Secondary | ICD-10-CM

## 2016-01-28 DIAGNOSIS — N6320 Unspecified lump in the left breast, unspecified quadrant: Secondary | ICD-10-CM

## 2016-01-28 DIAGNOSIS — Z0000 Encounter for general adult medical examination without abnormal findings: Secondary | ICD-10-CM | POA: Diagnosis not present

## 2016-01-28 DIAGNOSIS — N63 Unspecified lump in breast: Secondary | ICD-10-CM | POA: Diagnosis not present

## 2016-01-28 LAB — POCT URINALYSIS DIPSTICK
BILIRUBIN UA: NEGATIVE
GLUCOSE UA: NEGATIVE
Ketones, UA: NEGATIVE
Leukocytes, UA: NEGATIVE
Nitrite, UA: NEGATIVE
PH UA: 6
Protein, UA: NEGATIVE
RBC UA: NEGATIVE
UROBILINOGEN UA: NEGATIVE

## 2016-01-28 NOTE — Progress Notes (Signed)
Scheduled patient while in office for left breast diagnostic mammogram with ultrasound at the [facility] on 01/31/2016 at 2:10 pm. Patient is agreeable to date and time. Placed in mammogram hold.

## 2016-01-28 NOTE — Progress Notes (Signed)
Patient ID: April Henson, female   DOB: [birth year], 50 y.o.   MRN: [id number]  50 y.o. [id number] Married  Caucasian Fe here for [place] annual exam.  She had a recent Mammo and needs a follow up but has not been here since 2010.   She saw Dr. Sabra Heck for AUB, fibroids and dysmenorrhea. No other new health problems.  Some vaso symptoms that are tolerable.  Patient's last menstrual period was 12/13/2008 (approximate).          Sexually active: Yes.    The current method of family planning is status post hysterectomy 12/21/08    Exercising: Yes.    walking and light weights three times per week Smoker:  no  Health Maintenance: Pap:  05/22/08, Negative (hyst 12/21/2008) MMG: previous Breast, excision, Left 01/26/12 - MULTIPLE INTRADUCTAL PAPILLOMAS. - DILATED DUCTS AND FIBROCYSTIC CHANGES. - NO ATYPIA, HYPERPLASIA OR MALIGNANCY IDENTIFIED 01/19/16, 3D, Bi-Rads 0: need additional imaging; needs order TDaP:  Within last 10 years HIV: 2000 with pregnancy Labs: PCP takes care of all labs  Urine: Negative   reports that she has never smoked. She has never used smokeless tobacco. She reports that she does not drink alcohol or use drugs.  Past Medical History:  Diagnosis Date  . Hypertension    under control with meds., has been on meds. x 15 yrs.  . Nipple discharge 01/2012   left  . PONV (postoperative nausea and vomiting)     Past Surgical History:  Procedure Laterality Date  . BREAST DUCTAL SYSTEM EXCISION  01/26/2012   Procedure: EXCISION DUCTAL SYSTEM BREAST;  Surgeon: Stark Klein, MD;  Location: [facility];  Service: General;  Laterality: Left;  left breast major duct excision  . TOTAL VAGINAL HYSTERECTOMY  12/21/2008  . WISDOM TOOTH EXTRACTION  1986    Current Outpatient Prescriptions  Medication Sig Dispense Refill  . lisinopril (PRINIVIL,ZESTRIL) 5 MG tablet Take 5 mg by mouth daily.     No current facility-administered medications for this visit.     Family History  Problem  Relation Age of Onset  . Cancer Mother     uterine  . Hypertension Mother   . Hyperlipidemia Mother   . Stroke Mother   . Glaucoma Mother   . Rheum arthritis Mother   . Osteoporosis Mother   . Hypertension Father   . Heart disease Maternal Grandmother   . Parkinson's disease Maternal Grandfather     ROS:  Pertinent items are noted in HPI.  Otherwise, a comprehensive ROS was negative.  Exam:   BP 122/80 (BP Location: Left Arm, Patient Position: Sitting, Cuff Size: Normal)   Pulse 68   Ht 5' 1.5" (1.562 m)   Wt 147 lb (66.7 kg)   LMP 12/13/2008 (Approximate)   BMI 27.33 kg/m  Height: 5' 1.5" (156.2 cm) Ht Readings from Last 3 Encounters:  01/28/16 5' 1.5" (1.562 m)  02/15/12 5\' 2"  (1.575 m)  01/22/12 5\' 2"  (1.575 m)    General appearance: alert, cooperative and appears stated age Head: Normocephalic, without obvious abnormality, atraumatic Neck: no adenopathy, supple, symmetrical, trachea midline and thyroid normal to inspection and palpation Lungs: clear to auscultation bilaterally Breasts: normal appearance, no masses or tenderness, positive findings: 2 cm, smooth, round, soft and non-tender nodule located on the left at 1:30 position about 3 cm from the areola. Heart: regular rate and rhythm Abdomen: soft, non-tender; no masses,  no organomegaly Extremities: extremities normal, atraumatic, no cyanosis or edema Skin: Skin color,  texture, turgor normal. No rashes or lesions Lymph nodes: Cervical, supraclavicular, and axillary nodes normal. No abnormal inguinal nodes palpated Neurologic: Grossly normal   Pelvic: External genitalia:  no lesions              Urethra:  normal appearing urethra with no masses, tenderness or lesions              Bartholin's and Skene's: normal                 Vagina: normal appearing vagina with normal color and discharge, no lesions              Cervix: absent              Pap taken: Yes.   Bimanual Exam:  Uterus:  uterus absent               Adnexa: no mass, fullness, tenderness               Rectovaginal: Confirms               Anus:  normal sphincter tone, no lesions  Chaperone present: yes  A:  Well Woman with normal exam  S/P TVH  12/21/08 secondary to AUB, fibroids, and dysmenorrhea.  Remote history of abnormal pap about 1994 and treated with cryo  Vaso symptoms that are tolerable  S/P left breast lumpectomy for multiple intraductal papilloma 01/26/12  New breast lump X 2 weeks - needs further evaluation  History of HTN   P:   Reviewed health and wellness pertinent to exam  Pap smear was done  Mammogram diagnostic is scheduled and will follow  Counseled on breast self exam, adequate intake of calcium and vitamin D, diet and exercise return annually or prn  An After Visit Summary was printed and given to the patient.

## 2016-01-28 NOTE — Patient Instructions (Signed)

## 2016-01-30 NOTE — Progress Notes (Signed)
Encounter reviewed by Dr. Mattew Chriswell Amundson C. Silva.  

## 2016-01-31 ENCOUNTER — Ambulatory Visit
Admission: RE | Admit: 2016-01-31 | Discharge: 2016-01-31 | Disposition: A | Discharge status: Home / Self Care | Payer: Commercial Managed Care - HMO | Source: Ambulatory Visit | Attending: Nurse Practitioner | Admitting: Nurse Practitioner

## 2016-01-31 ENCOUNTER — Other Ambulatory Visit: Payer: Self-pay | Admitting: Nurse Practitioner

## 2016-01-31 DIAGNOSIS — N6320 Unspecified lump in the left breast, unspecified quadrant: Secondary | ICD-10-CM

## 2016-02-01 LAB — IPS PAP TEST WITH HPV

## 2016-02-08 ENCOUNTER — Ambulatory Visit
Admission: RE | Admit: 2016-02-08 | Discharge: 2016-02-08 | Disposition: A | Discharge status: Home / Self Care | Payer: Commercial Managed Care - HMO | Source: Ambulatory Visit | Attending: Nurse Practitioner | Admitting: Nurse Practitioner

## 2016-02-08 ENCOUNTER — Other Ambulatory Visit: Payer: Self-pay | Admitting: Nurse Practitioner

## 2016-02-08 DIAGNOSIS — N6320 Unspecified lump in the left breast, unspecified quadrant: Secondary | ICD-10-CM

## 2016-02-09 ENCOUNTER — Telehealth: Payer: Self-pay | Admitting: *Deleted

## 2016-02-09 NOTE — Telephone Encounter (Signed)
Spoke with patient to confirm [facility] appt for 10/4. Patient stated she could not talk right now and would call me back.

## 2016-02-10 ENCOUNTER — Telehealth: Payer: Self-pay | Admitting: Nurse Practitioner

## 2016-02-10 DIAGNOSIS — C50912 Malignant neoplasm of unspecified site of left female breast: Secondary | ICD-10-CM

## 2016-02-10 NOTE — Telephone Encounter (Signed)
Spoke with Randel Pigg with [place]. Maudie Mercury states that for patients with new diagnosis of breast cancer their refer patients to surgeon  Dr.Mark Arrendondo who is with [facility] at [address] [place], [address]. Per Maudie Mercury the patient will be seen for evaluation with Dr.Arrendondo and then referral will be placed to [facility].  Dr.Silva, okay to proceed with referral at this time?

## 2016-02-10 NOTE — Telephone Encounter (Signed)
Spoke with Museum/gallery conservator at [place] with [place]. Per [employer] with breast cancer patients who need referrals for treatment they are referred to [facility] in [facility] where they are able to receive radiation and chemotherapy if needed. Referral placed through [facility] to Dr.Arrendondo. Patient demographics, insurance, and diagnosis information faxed to [phone] with cover sheet and confirmation. Per Jinny Blossom their office will contact the patient directly to schedule.  Left message for patient to call [place] at [phone].

## 2016-02-10 NOTE — Telephone Encounter (Signed)
OK to place referral, but patient will need to understand if she will be able to complete her care in [facility] or if she will need to be traveling to [facility] also.  This may affect her choice in starting her care in [facility].

## 2016-02-10 NOTE — Telephone Encounter (Signed)
Patient is returning a call to Kaitlyn. °

## 2016-02-10 NOTE — Telephone Encounter (Signed)
Spoke with patient. Advised patient I have spoken with [place] for recommendations of breast oncologists in [facility]. Advised per discussion with Breast Care Navigator referral will need to be placed to Dr.Arrendondo who is a Psychologist, sport and exercise at [place]. Advised I have placed this referral. Dr.Arrendondo's office will contact her directly to schedule her first appointment. Advised if referral is needed to medical oncologist she will be referred to [facility] in [facility]. Patient is agreeable and verbalizes understanding.  Cc: Kem Boroughs, FNP   Routing to provider for final review. Patient agreeable to disposition. Will close encounter.

## 2016-02-10 NOTE — Telephone Encounter (Signed)
Spoke with Jeani Hawking from the [facility]. Jeani Hawking states the patient has been diagnosed with left breast cancer. The patient was referred to [facility] at [facility], but after discussing this with her spouse has decided that being seen in [facility] will be better for them. Per Jeani Hawking the [facility] is unable to refer outside of the [place] area. She is requesting our office make a referral for the patient to a provider in [facility]. Advised I will review with our physicians regarding referral recommendations and will start referral process. Jeani Hawking is agreeable.  Routing to [place] for review and advise.

## 2016-02-10 NOTE — Telephone Encounter (Signed)
April Henson with the [place] [place] calling regarding patient's biopsy which showed left breast cancer. Referral was made for patient to see someone in [place] but she lives in [place] and would like a referral to someone there. Breast center number is [phone].

## 2016-02-10 NOTE — Telephone Encounter (Signed)
Call to [employer] left a message for Randel Pigg who is the Breast Health Navigator. Requested return call to discuss recommendations for breast oncologists and referral process.

## 2016-02-10 NOTE — Telephone Encounter (Signed)
I would recommend contacting [facility] to see if they have breast oncologists who serve in the [employer].   [place]

## 2016-02-15 ENCOUNTER — Telehealth: Payer: Self-pay | Admitting: Nurse Practitioner

## 2016-02-15 NOTE — Telephone Encounter (Signed)
Pt was called and informed that we are aware of her new diagnosis of breast cancer.  Offered support and any help with getting MD information in [facility].  She is appreciative of call and will let us know if anything else needs to be done.

## 2016-02-16 ENCOUNTER — Telehealth: Payer: Self-pay | Admitting: Nurse Practitioner

## 2016-02-16 NOTE — Telephone Encounter (Signed)
Patient has questions about biopsy results

## 2016-02-21 NOTE — Telephone Encounter (Signed)
Please contact patient to understand if she has established care with an oncology team for her new diagnosis of breast cancer.  She was referred to Dr Adair Laundry, the breast surgeon, on 02/14/16.

## 2016-02-22 NOTE — Telephone Encounter (Signed)
Left message to call Kaitlyn at [phone]. 

## 2016-02-23 NOTE — Telephone Encounter (Signed)
Ok to remove from mammogram hold.  

## 2016-02-23 NOTE — Telephone Encounter (Signed)
Spoke with patient. Patient states that she has established care with Dr.Arrendondo and has had multiple appointments. She is established with an oncology team. States she does not have any questions at this time as she has discussed them with Dr.Arrendondo.  Routing to provider for final review. Patient agreeable to disposition. Will close encounter.

## 2016-02-23 NOTE — Telephone Encounter (Signed)
Patient removed from mammogram hold.

## 2016-02-23 NOTE — Telephone Encounter (Signed)
Patient is returning your call.  

## 2016-12-29 ENCOUNTER — Other Ambulatory Visit: Payer: Self-pay | Admitting: Radiation Oncology

## 2016-12-29 DIAGNOSIS — Z853 Personal history of malignant neoplasm of breast: Secondary | ICD-10-CM

## 2017-01-24 ENCOUNTER — Ambulatory Visit
Admission: RE | Admit: 2017-01-24 | Discharge: 2017-01-24 | Disposition: A | Discharge status: Home / Self Care | Payer: 59 | Source: Ambulatory Visit | Attending: Radiation Oncology | Admitting: Radiation Oncology

## 2017-01-24 DIAGNOSIS — Z853 Personal history of malignant neoplasm of breast: Secondary | ICD-10-CM

## 2017-01-24 HISTORY — DX: Personal history of antineoplastic chemotherapy: Z92.21

## 2017-01-24 HISTORY — DX: Personal history of irradiation: Z92.3

## 2019-04-16 IMAGING — MG 2D DIGITAL DIAGNOSTIC BILATERAL MAMMOGRAM WITH CAD AND ADJUNCT T
8 of 16 series · 8 of 36 positions shown · non-contrast
Comparison: Previous exam(s).

CLINICAL DATA: 51-year-old female status post left breast
lumpectomy in 2259 and subareolar duct excision in 7646.

EXAM:
2D DIGITAL DIAGNOSTIC BILATERAL MAMMOGRAM WITH CAD AND ADJUNCT TOMO

[L MLO (1 of 2)]
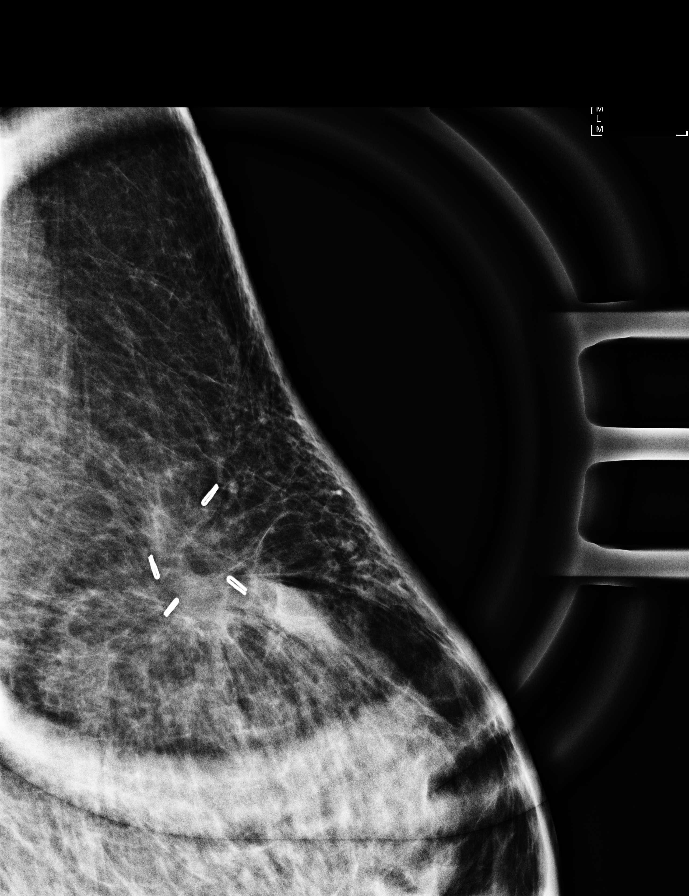

[R CC]
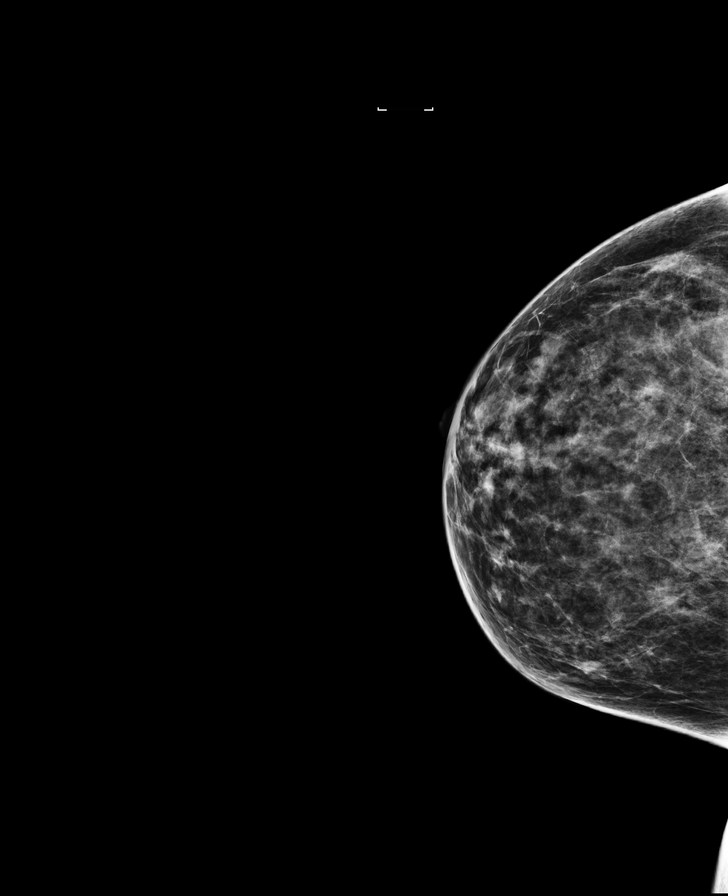

[L MLO (2 of 2)]
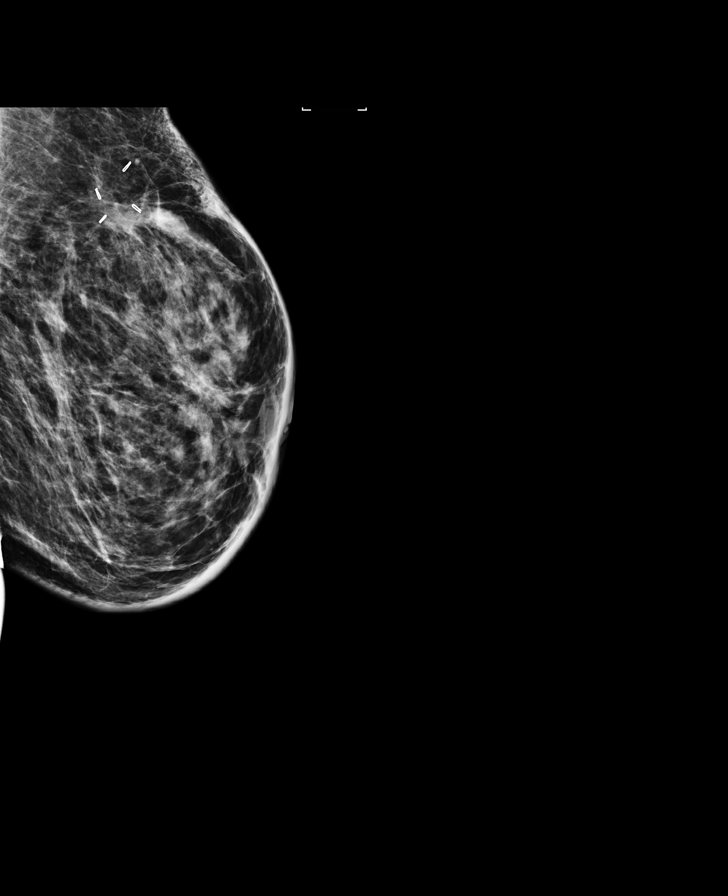

[R MLO (1 of 2)]
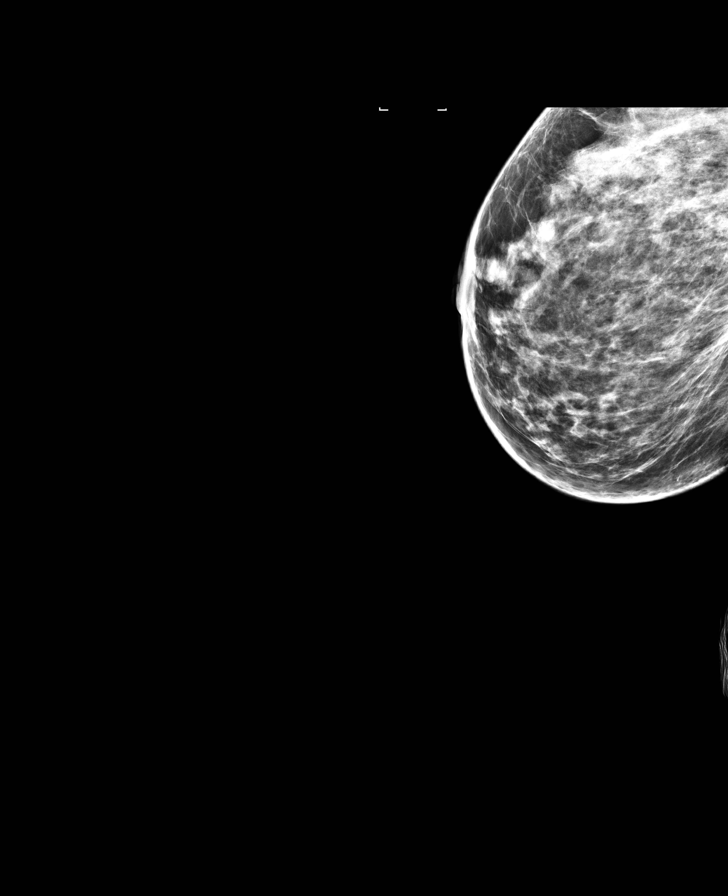

[R MLO (2 of 2)]
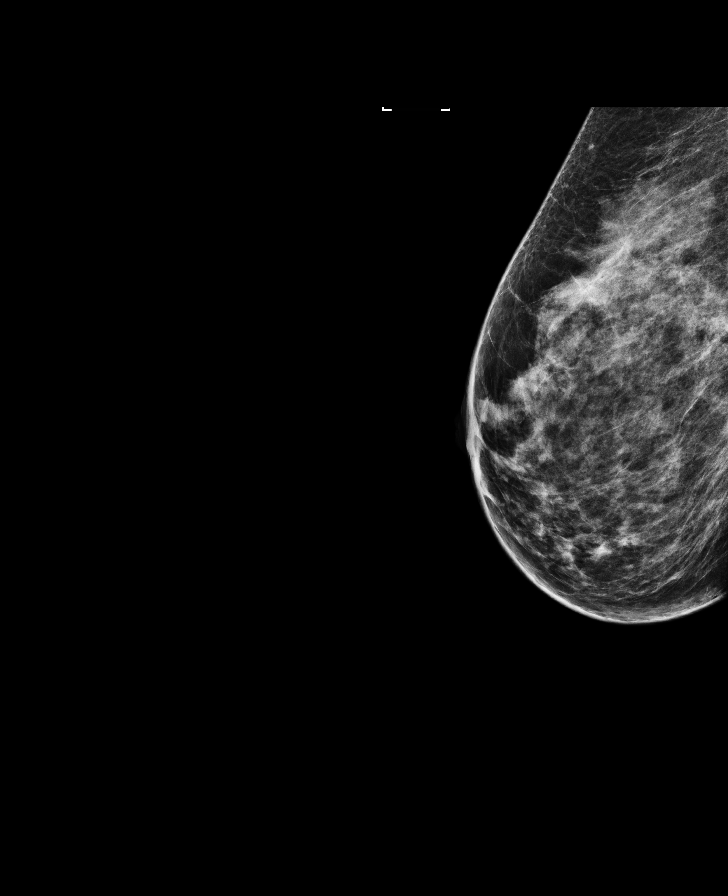

[L MLO synth-2D]
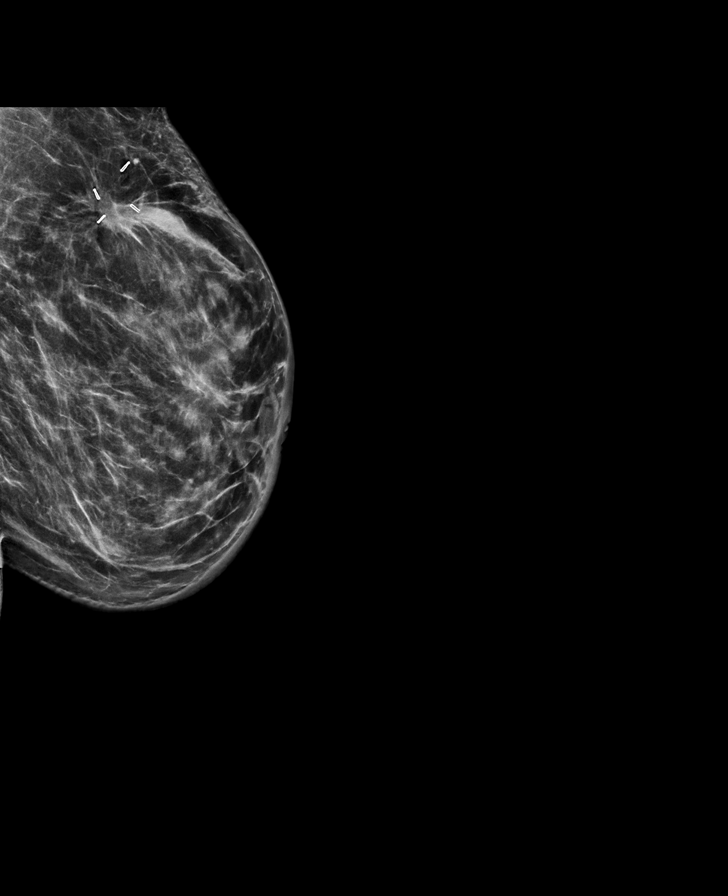

[L CC]
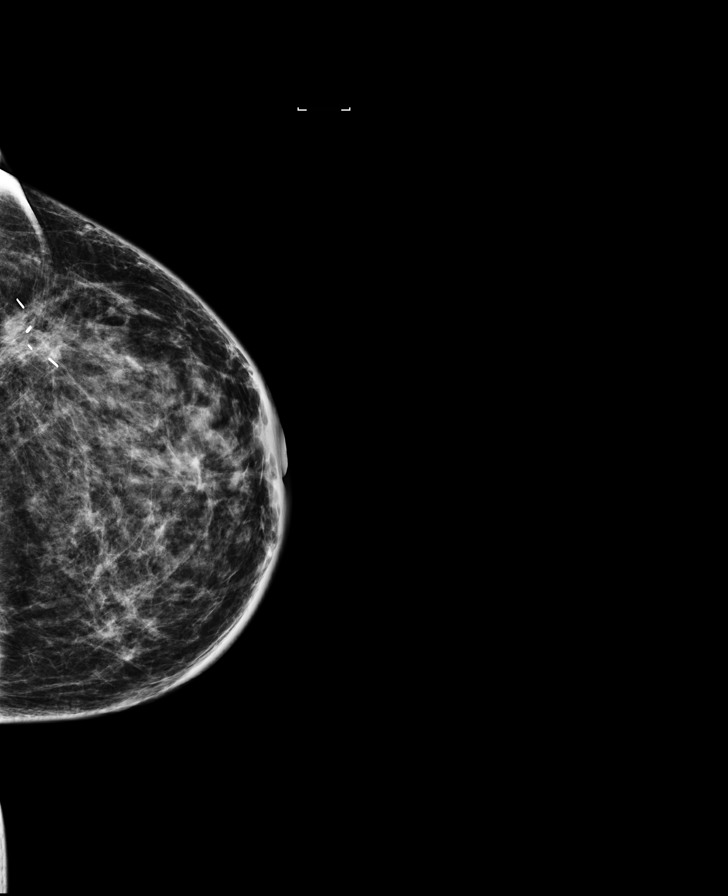

[R MLO synth-2D]
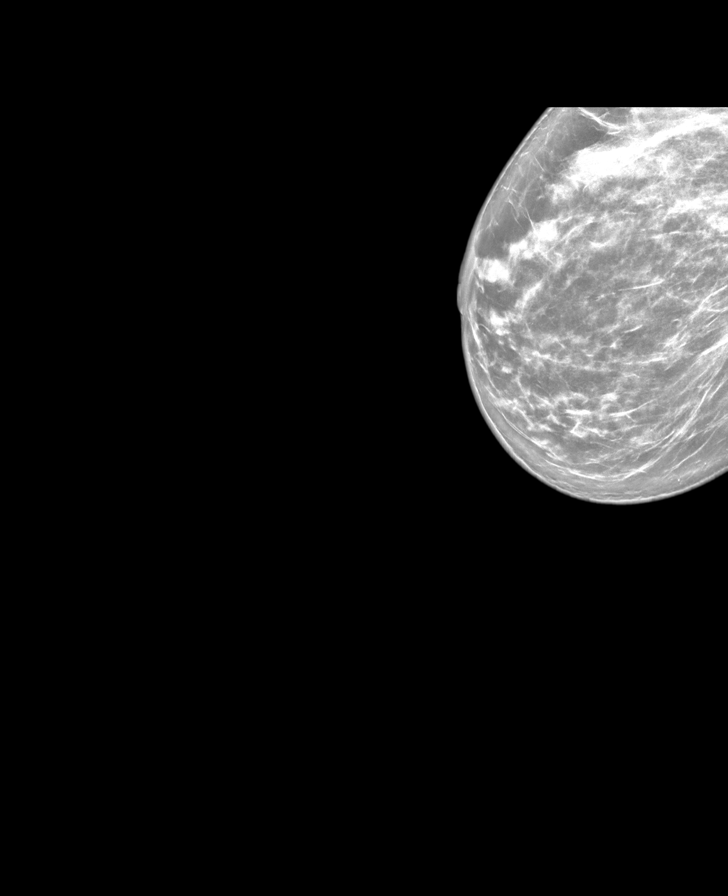

[8 of 36 positions shown; findings below may reference images not displayed]

ACR Breast Density Category c: The breast tissue is heterogeneously
dense, which may obscure small masses.
FINDINGS: Stable postsurgical changes are noted in the upper outer left breast
posteriorly and subareolar region. No suspicious masses or
calcifications are identified in either breast.

Mammographic images were processed with CAD.
IMPRESSION: 1. No mammographic evidence of malignancy in either breast.
2. Stable left breast postsurgical changes.

RECOMMENDATION:
Diagnostic mammogram is suggested in 1 year. (Code:MP-S-NSK)

I have discussed the findings and recommendations with the patient.
Results were also provided in writing at the conclusion of the
visit. If applicable, a reminder letter will be sent to the patient
regarding the next appointment.

BI-RADS CATEGORY  2: Benign.
